# Patient Record
Sex: Male | Born: 1995
Health system: Southern US, Community
[De-identification: ages and names within clinical notes are randomized; demographics above are authoritative.]

## PROBLEM LIST (undated history)

## (undated) DIAGNOSIS — L700 Acne vulgaris: Secondary | ICD-10-CM

## (undated) DIAGNOSIS — B2790 Infectious mononucleosis, unspecified without complication: Principal | ICD-10-CM

## (undated) DIAGNOSIS — T7840XA Allergy, unspecified, initial encounter: Secondary | ICD-10-CM

## (undated) HISTORY — DX: Infectious mononucleosis, unspecified without complication: B27.90

## (undated) HISTORY — DX: Acne vulgaris: L70.0

## (undated) HISTORY — DX: Allergy, unspecified, initial encounter: T78.40XA

---
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
## 2017-03-18 DIAGNOSIS — F1420 Cocaine dependence, uncomplicated: Secondary | ICD-10-CM | POA: Diagnosis not present

## 2017-03-18 DIAGNOSIS — F1620 Hallucinogen dependence, uncomplicated: Secondary | ICD-10-CM | POA: Diagnosis not present

## 2017-03-18 DIAGNOSIS — F1220 Cannabis dependence, uncomplicated: Secondary | ICD-10-CM | POA: Diagnosis not present

## 2017-03-18 DIAGNOSIS — F1320 Sedative, hypnotic or anxiolytic dependence, uncomplicated: Secondary | ICD-10-CM | POA: Diagnosis not present

## 2017-03-19 DIAGNOSIS — F1420 Cocaine dependence, uncomplicated: Secondary | ICD-10-CM | POA: Diagnosis not present

## 2017-03-19 DIAGNOSIS — F1620 Hallucinogen dependence, uncomplicated: Secondary | ICD-10-CM | POA: Diagnosis not present

## 2017-03-19 DIAGNOSIS — F1320 Sedative, hypnotic or anxiolytic dependence, uncomplicated: Secondary | ICD-10-CM | POA: Diagnosis not present

## 2017-03-19 DIAGNOSIS — F1220 Cannabis dependence, uncomplicated: Secondary | ICD-10-CM | POA: Diagnosis not present

## 2017-03-20 DIAGNOSIS — F1420 Cocaine dependence, uncomplicated: Secondary | ICD-10-CM | POA: Diagnosis not present

## 2017-03-20 DIAGNOSIS — F1220 Cannabis dependence, uncomplicated: Secondary | ICD-10-CM | POA: Diagnosis not present

## 2017-03-20 DIAGNOSIS — F1620 Hallucinogen dependence, uncomplicated: Secondary | ICD-10-CM | POA: Diagnosis not present

## 2017-03-20 DIAGNOSIS — F1320 Sedative, hypnotic or anxiolytic dependence, uncomplicated: Secondary | ICD-10-CM | POA: Diagnosis not present

## 2017-03-21 DIAGNOSIS — F1320 Sedative, hypnotic or anxiolytic dependence, uncomplicated: Secondary | ICD-10-CM | POA: Diagnosis not present

## 2017-03-21 DIAGNOSIS — F1420 Cocaine dependence, uncomplicated: Secondary | ICD-10-CM | POA: Diagnosis not present

## 2017-03-21 DIAGNOSIS — F1220 Cannabis dependence, uncomplicated: Secondary | ICD-10-CM | POA: Diagnosis not present

## 2017-03-21 DIAGNOSIS — F1620 Hallucinogen dependence, uncomplicated: Secondary | ICD-10-CM | POA: Diagnosis not present

## 2017-03-22 DIAGNOSIS — F1320 Sedative, hypnotic or anxiolytic dependence, uncomplicated: Secondary | ICD-10-CM | POA: Diagnosis not present

## 2017-03-22 DIAGNOSIS — F1420 Cocaine dependence, uncomplicated: Secondary | ICD-10-CM | POA: Diagnosis not present

## 2017-03-22 DIAGNOSIS — F1220 Cannabis dependence, uncomplicated: Secondary | ICD-10-CM | POA: Diagnosis not present

## 2017-03-22 DIAGNOSIS — F1620 Hallucinogen dependence, uncomplicated: Secondary | ICD-10-CM | POA: Diagnosis not present

## 2017-03-25 DIAGNOSIS — F1220 Cannabis dependence, uncomplicated: Secondary | ICD-10-CM | POA: Diagnosis not present

## 2017-03-25 DIAGNOSIS — F1320 Sedative, hypnotic or anxiolytic dependence, uncomplicated: Secondary | ICD-10-CM | POA: Diagnosis not present

## 2017-03-25 DIAGNOSIS — F1620 Hallucinogen dependence, uncomplicated: Secondary | ICD-10-CM | POA: Diagnosis not present

## 2017-03-25 DIAGNOSIS — F1420 Cocaine dependence, uncomplicated: Secondary | ICD-10-CM | POA: Diagnosis not present

## 2017-03-26 DIAGNOSIS — F1220 Cannabis dependence, uncomplicated: Secondary | ICD-10-CM | POA: Diagnosis not present

## 2017-03-26 DIAGNOSIS — F1420 Cocaine dependence, uncomplicated: Secondary | ICD-10-CM | POA: Diagnosis not present

## 2017-03-26 DIAGNOSIS — F1320 Sedative, hypnotic or anxiolytic dependence, uncomplicated: Secondary | ICD-10-CM | POA: Diagnosis not present

## 2017-03-26 DIAGNOSIS — F1620 Hallucinogen dependence, uncomplicated: Secondary | ICD-10-CM | POA: Diagnosis not present

## 2017-03-27 DIAGNOSIS — F1320 Sedative, hypnotic or anxiolytic dependence, uncomplicated: Secondary | ICD-10-CM | POA: Diagnosis not present

## 2017-03-27 DIAGNOSIS — F1420 Cocaine dependence, uncomplicated: Secondary | ICD-10-CM | POA: Diagnosis not present

## 2017-03-27 DIAGNOSIS — F1220 Cannabis dependence, uncomplicated: Secondary | ICD-10-CM | POA: Diagnosis not present

## 2017-03-27 DIAGNOSIS — F1620 Hallucinogen dependence, uncomplicated: Secondary | ICD-10-CM | POA: Diagnosis not present

## 2017-03-28 DIAGNOSIS — F1220 Cannabis dependence, uncomplicated: Secondary | ICD-10-CM | POA: Diagnosis not present

## 2017-03-28 DIAGNOSIS — F1620 Hallucinogen dependence, uncomplicated: Secondary | ICD-10-CM | POA: Diagnosis not present

## 2017-03-28 DIAGNOSIS — F1320 Sedative, hypnotic or anxiolytic dependence, uncomplicated: Secondary | ICD-10-CM | POA: Diagnosis not present

## 2017-03-28 DIAGNOSIS — F1420 Cocaine dependence, uncomplicated: Secondary | ICD-10-CM | POA: Diagnosis not present

## 2017-03-29 DIAGNOSIS — F1320 Sedative, hypnotic or anxiolytic dependence, uncomplicated: Secondary | ICD-10-CM | POA: Diagnosis not present

## 2017-03-29 DIAGNOSIS — F1220 Cannabis dependence, uncomplicated: Secondary | ICD-10-CM | POA: Diagnosis not present

## 2017-03-29 DIAGNOSIS — F1420 Cocaine dependence, uncomplicated: Secondary | ICD-10-CM | POA: Diagnosis not present

## 2017-03-29 DIAGNOSIS — F1620 Hallucinogen dependence, uncomplicated: Secondary | ICD-10-CM | POA: Diagnosis not present

## 2017-04-01 DIAGNOSIS — F1320 Sedative, hypnotic or anxiolytic dependence, uncomplicated: Secondary | ICD-10-CM | POA: Diagnosis not present

## 2017-04-01 DIAGNOSIS — F1420 Cocaine dependence, uncomplicated: Secondary | ICD-10-CM | POA: Diagnosis not present

## 2017-04-01 DIAGNOSIS — F1220 Cannabis dependence, uncomplicated: Secondary | ICD-10-CM | POA: Diagnosis not present

## 2017-04-01 DIAGNOSIS — F1620 Hallucinogen dependence, uncomplicated: Secondary | ICD-10-CM | POA: Diagnosis not present

## 2017-04-02 DIAGNOSIS — F1420 Cocaine dependence, uncomplicated: Secondary | ICD-10-CM | POA: Diagnosis not present

## 2017-04-02 DIAGNOSIS — F1620 Hallucinogen dependence, uncomplicated: Secondary | ICD-10-CM | POA: Diagnosis not present

## 2017-04-02 DIAGNOSIS — F1220 Cannabis dependence, uncomplicated: Secondary | ICD-10-CM | POA: Diagnosis not present

## 2017-04-02 DIAGNOSIS — F1320 Sedative, hypnotic or anxiolytic dependence, uncomplicated: Secondary | ICD-10-CM | POA: Diagnosis not present

## 2017-04-03 DIAGNOSIS — F1220 Cannabis dependence, uncomplicated: Secondary | ICD-10-CM | POA: Diagnosis not present

## 2017-04-03 DIAGNOSIS — F1320 Sedative, hypnotic or anxiolytic dependence, uncomplicated: Secondary | ICD-10-CM | POA: Diagnosis not present

## 2017-04-03 DIAGNOSIS — F1620 Hallucinogen dependence, uncomplicated: Secondary | ICD-10-CM | POA: Diagnosis not present

## 2017-04-03 DIAGNOSIS — F1420 Cocaine dependence, uncomplicated: Secondary | ICD-10-CM | POA: Diagnosis not present

## 2017-04-04 DIAGNOSIS — F1620 Hallucinogen dependence, uncomplicated: Secondary | ICD-10-CM | POA: Diagnosis not present

## 2017-04-04 DIAGNOSIS — F1220 Cannabis dependence, uncomplicated: Secondary | ICD-10-CM | POA: Diagnosis not present

## 2017-04-04 DIAGNOSIS — F1420 Cocaine dependence, uncomplicated: Secondary | ICD-10-CM | POA: Diagnosis not present

## 2017-04-04 DIAGNOSIS — F1320 Sedative, hypnotic or anxiolytic dependence, uncomplicated: Secondary | ICD-10-CM | POA: Diagnosis not present

## 2017-04-05 DIAGNOSIS — F1420 Cocaine dependence, uncomplicated: Secondary | ICD-10-CM | POA: Diagnosis not present

## 2017-04-05 DIAGNOSIS — F1320 Sedative, hypnotic or anxiolytic dependence, uncomplicated: Secondary | ICD-10-CM | POA: Diagnosis not present

## 2017-04-05 DIAGNOSIS — F1620 Hallucinogen dependence, uncomplicated: Secondary | ICD-10-CM | POA: Diagnosis not present

## 2017-04-05 DIAGNOSIS — F1220 Cannabis dependence, uncomplicated: Secondary | ICD-10-CM | POA: Diagnosis not present

## 2017-04-08 DIAGNOSIS — F1320 Sedative, hypnotic or anxiolytic dependence, uncomplicated: Secondary | ICD-10-CM | POA: Diagnosis not present

## 2017-04-08 DIAGNOSIS — F1620 Hallucinogen dependence, uncomplicated: Secondary | ICD-10-CM | POA: Diagnosis not present

## 2017-04-08 DIAGNOSIS — F1220 Cannabis dependence, uncomplicated: Secondary | ICD-10-CM | POA: Diagnosis not present

## 2017-04-08 DIAGNOSIS — F1420 Cocaine dependence, uncomplicated: Secondary | ICD-10-CM | POA: Diagnosis not present

## 2017-04-09 DIAGNOSIS — F1420 Cocaine dependence, uncomplicated: Secondary | ICD-10-CM | POA: Diagnosis not present

## 2017-04-09 DIAGNOSIS — F1320 Sedative, hypnotic or anxiolytic dependence, uncomplicated: Secondary | ICD-10-CM | POA: Diagnosis not present

## 2017-04-09 DIAGNOSIS — F1620 Hallucinogen dependence, uncomplicated: Secondary | ICD-10-CM | POA: Diagnosis not present

## 2017-04-09 DIAGNOSIS — F1220 Cannabis dependence, uncomplicated: Secondary | ICD-10-CM | POA: Diagnosis not present

## 2017-04-10 DIAGNOSIS — F1620 Hallucinogen dependence, uncomplicated: Secondary | ICD-10-CM | POA: Diagnosis not present

## 2017-04-10 DIAGNOSIS — F1320 Sedative, hypnotic or anxiolytic dependence, uncomplicated: Secondary | ICD-10-CM | POA: Diagnosis not present

## 2017-04-10 DIAGNOSIS — F1420 Cocaine dependence, uncomplicated: Secondary | ICD-10-CM | POA: Diagnosis not present

## 2017-04-10 DIAGNOSIS — F1220 Cannabis dependence, uncomplicated: Secondary | ICD-10-CM | POA: Diagnosis not present

## 2017-04-11 DIAGNOSIS — F1220 Cannabis dependence, uncomplicated: Secondary | ICD-10-CM | POA: Diagnosis not present

## 2017-04-11 DIAGNOSIS — F1320 Sedative, hypnotic or anxiolytic dependence, uncomplicated: Secondary | ICD-10-CM | POA: Diagnosis not present

## 2017-04-11 DIAGNOSIS — F1420 Cocaine dependence, uncomplicated: Secondary | ICD-10-CM | POA: Diagnosis not present

## 2017-04-11 DIAGNOSIS — F1620 Hallucinogen dependence, uncomplicated: Secondary | ICD-10-CM | POA: Diagnosis not present

## 2017-04-12 DIAGNOSIS — F1420 Cocaine dependence, uncomplicated: Secondary | ICD-10-CM | POA: Diagnosis not present

## 2017-04-12 DIAGNOSIS — F1620 Hallucinogen dependence, uncomplicated: Secondary | ICD-10-CM | POA: Diagnosis not present

## 2017-04-12 DIAGNOSIS — F1220 Cannabis dependence, uncomplicated: Secondary | ICD-10-CM | POA: Diagnosis not present

## 2017-04-12 DIAGNOSIS — F1320 Sedative, hypnotic or anxiolytic dependence, uncomplicated: Secondary | ICD-10-CM | POA: Diagnosis not present

## 2017-04-15 DIAGNOSIS — F1420 Cocaine dependence, uncomplicated: Secondary | ICD-10-CM | POA: Diagnosis not present

## 2017-04-15 DIAGNOSIS — F1320 Sedative, hypnotic or anxiolytic dependence, uncomplicated: Secondary | ICD-10-CM | POA: Diagnosis not present

## 2017-04-15 DIAGNOSIS — F1220 Cannabis dependence, uncomplicated: Secondary | ICD-10-CM | POA: Diagnosis not present

## 2017-04-15 DIAGNOSIS — F1620 Hallucinogen dependence, uncomplicated: Secondary | ICD-10-CM | POA: Diagnosis not present

## 2017-04-16 DIAGNOSIS — F1320 Sedative, hypnotic or anxiolytic dependence, uncomplicated: Secondary | ICD-10-CM | POA: Diagnosis not present

## 2017-04-16 DIAGNOSIS — F1420 Cocaine dependence, uncomplicated: Secondary | ICD-10-CM | POA: Diagnosis not present

## 2017-04-16 DIAGNOSIS — F1620 Hallucinogen dependence, uncomplicated: Secondary | ICD-10-CM | POA: Diagnosis not present

## 2017-04-16 DIAGNOSIS — F1220 Cannabis dependence, uncomplicated: Secondary | ICD-10-CM | POA: Diagnosis not present

## 2017-04-17 DIAGNOSIS — F1620 Hallucinogen dependence, uncomplicated: Secondary | ICD-10-CM | POA: Diagnosis not present

## 2017-04-17 DIAGNOSIS — F1420 Cocaine dependence, uncomplicated: Secondary | ICD-10-CM | POA: Diagnosis not present

## 2017-04-17 DIAGNOSIS — F1320 Sedative, hypnotic or anxiolytic dependence, uncomplicated: Secondary | ICD-10-CM | POA: Diagnosis not present

## 2017-04-17 DIAGNOSIS — F1220 Cannabis dependence, uncomplicated: Secondary | ICD-10-CM | POA: Diagnosis not present

## 2017-04-18 DIAGNOSIS — F1320 Sedative, hypnotic or anxiolytic dependence, uncomplicated: Secondary | ICD-10-CM | POA: Diagnosis not present

## 2017-04-18 DIAGNOSIS — F1220 Cannabis dependence, uncomplicated: Secondary | ICD-10-CM | POA: Diagnosis not present

## 2017-04-18 DIAGNOSIS — F1620 Hallucinogen dependence, uncomplicated: Secondary | ICD-10-CM | POA: Diagnosis not present

## 2017-04-18 DIAGNOSIS — F1420 Cocaine dependence, uncomplicated: Secondary | ICD-10-CM | POA: Diagnosis not present

## 2017-04-19 DIAGNOSIS — F1320 Sedative, hypnotic or anxiolytic dependence, uncomplicated: Secondary | ICD-10-CM | POA: Diagnosis not present

## 2017-04-19 DIAGNOSIS — F1220 Cannabis dependence, uncomplicated: Secondary | ICD-10-CM | POA: Diagnosis not present

## 2017-04-19 DIAGNOSIS — F1620 Hallucinogen dependence, uncomplicated: Secondary | ICD-10-CM | POA: Diagnosis not present

## 2017-04-19 DIAGNOSIS — F1420 Cocaine dependence, uncomplicated: Secondary | ICD-10-CM | POA: Diagnosis not present

## 2017-04-22 DIAGNOSIS — F1420 Cocaine dependence, uncomplicated: Secondary | ICD-10-CM | POA: Diagnosis not present

## 2017-04-22 DIAGNOSIS — F1220 Cannabis dependence, uncomplicated: Secondary | ICD-10-CM | POA: Diagnosis not present

## 2017-04-22 DIAGNOSIS — F1320 Sedative, hypnotic or anxiolytic dependence, uncomplicated: Secondary | ICD-10-CM | POA: Diagnosis not present

## 2017-04-22 DIAGNOSIS — F1620 Hallucinogen dependence, uncomplicated: Secondary | ICD-10-CM | POA: Diagnosis not present

## 2017-04-23 DIAGNOSIS — F1620 Hallucinogen dependence, uncomplicated: Secondary | ICD-10-CM | POA: Diagnosis not present

## 2017-04-23 DIAGNOSIS — F1320 Sedative, hypnotic or anxiolytic dependence, uncomplicated: Secondary | ICD-10-CM | POA: Diagnosis not present

## 2017-04-23 DIAGNOSIS — F1420 Cocaine dependence, uncomplicated: Secondary | ICD-10-CM | POA: Diagnosis not present

## 2017-04-23 DIAGNOSIS — F1220 Cannabis dependence, uncomplicated: Secondary | ICD-10-CM | POA: Diagnosis not present

## 2017-04-24 DIAGNOSIS — F1220 Cannabis dependence, uncomplicated: Secondary | ICD-10-CM | POA: Diagnosis not present

## 2017-04-24 DIAGNOSIS — F1420 Cocaine dependence, uncomplicated: Secondary | ICD-10-CM | POA: Diagnosis not present

## 2017-04-24 DIAGNOSIS — F1320 Sedative, hypnotic or anxiolytic dependence, uncomplicated: Secondary | ICD-10-CM | POA: Diagnosis not present

## 2017-04-24 DIAGNOSIS — F1620 Hallucinogen dependence, uncomplicated: Secondary | ICD-10-CM | POA: Diagnosis not present

## 2017-04-25 DIAGNOSIS — F1220 Cannabis dependence, uncomplicated: Secondary | ICD-10-CM | POA: Diagnosis not present

## 2017-04-25 DIAGNOSIS — F1320 Sedative, hypnotic or anxiolytic dependence, uncomplicated: Secondary | ICD-10-CM | POA: Diagnosis not present

## 2017-04-25 DIAGNOSIS — F1420 Cocaine dependence, uncomplicated: Secondary | ICD-10-CM | POA: Diagnosis not present

## 2017-04-25 DIAGNOSIS — F1620 Hallucinogen dependence, uncomplicated: Secondary | ICD-10-CM | POA: Diagnosis not present

## 2017-04-26 DIAGNOSIS — F1420 Cocaine dependence, uncomplicated: Secondary | ICD-10-CM | POA: Diagnosis not present

## 2017-04-26 DIAGNOSIS — F1220 Cannabis dependence, uncomplicated: Secondary | ICD-10-CM | POA: Diagnosis not present

## 2017-04-26 DIAGNOSIS — F1620 Hallucinogen dependence, uncomplicated: Secondary | ICD-10-CM | POA: Diagnosis not present

## 2017-04-26 DIAGNOSIS — F1320 Sedative, hypnotic or anxiolytic dependence, uncomplicated: Secondary | ICD-10-CM | POA: Diagnosis not present

## 2017-06-21 ENCOUNTER — Telehealth: Payer: Self-pay | Admitting: Family Medicine

## 2017-06-21 NOTE — Telephone Encounter (Signed)
Sent to PCP for approval.  

## 2017-06-21 NOTE — Telephone Encounter (Signed)
Call in Depakote 125 mg to give bid, #10

## 2017-06-21 NOTE — Telephone Encounter (Signed)
Copied from CRM [id number]. Topic: Quick Communication - See Telephone Encounter >> Jun 21, 2017  3:25 PM Landry MellowFoltz, Melissa J wrote: CRM for notification. See Telephone encounter for:   06/21/17. Pt will be going into intensive outpatient rehab next Friday.  The doctor at that facility is recommending that the pt have 5 days of low dose depakote before he goes into rehab. Mom will be the one giving it to him. Mom says that pt is currently taking 2 xanax /day for the last 2 weeks.  Pharm [facility] [address] cb # for mom is [phone]

## 2017-06-24 MED ORDER — DIVALPROEX SODIUM 125 MG PO DR TAB: 125.0000 mg | DELAYED_RELEASE_TABLET | Freq: Two times a day (BID) | ORAL | 0 refills | Status: DC

## 2017-06-24 NOTE — Telephone Encounter (Signed)
Rx was sent. Called and spoke to pt's mother. Mother advised and voiced understanding.

## 2017-11-06 ENCOUNTER — Encounter: Payer: Self-pay | Admitting: Family Medicine

## 2017-11-06 ENCOUNTER — Ambulatory Visit (INDEPENDENT_AMBULATORY_CARE_PROVIDER_SITE_OTHER): Payer: Self-pay | Admitting: Family Medicine

## 2017-11-06 VITALS — BP 112/62 | HR 77 | Temp 98.4°F | Ht 70.0 in | Wt 147.7 lb

## 2017-11-06 DIAGNOSIS — W57XXXA Bitten or stung by nonvenomous insect and other nonvenomous arthropods, initial encounter: Secondary | ICD-10-CM

## 2017-11-06 DIAGNOSIS — H02846 Edema of left eye, unspecified eyelid: Secondary | ICD-10-CM

## 2017-11-06 DIAGNOSIS — L237 Allergic contact dermatitis due to plants, except food: Secondary | ICD-10-CM

## 2017-11-06 MED ORDER — PREDNISONE 10 MG PO TABS: ORAL_TABLET | ORAL | 0 refills | Status: DC

## 2017-11-06 MED ORDER — DOXYCYCLINE HYCLATE 100 MG PO TABS: 200.0000 mg | ORAL_TABLET | Freq: Once | ORAL | 0 refills | Status: AC

## 2017-11-06 NOTE — Progress Notes (Signed)
Subjective:    Patient ID: Kevin Howe, male    DOB: 04-03-1996, 22 y.o.   MRN: [id number]  Chief Complaint  Patient presents with  . Poison Ivy    poss poison ivyis left eye. Pt stated that he discovered poison ivy on his arms about two days ago left eye doesn't ear feels more painful. Pt cuts down trees for work   Patient is accompanied by his girlfriend.  HPI Patient was seen today for acute concern.  Pt endorses cutting down some trees/bushes for work on Friday when some poison ivy fell against him.  Pt notes pruritic rash on right forearm, posterior right leg, left leg.  Pt also endorses left eyelid edema.  Pt states he has been rubbing his eye.  Pt denies pain with movement of eyeball, facial pain, changes in vision.  Pt also endorses finding several ticks on him over the weekend. Does not report them being attached.  Pt has tried hydrocortisone cream.   Past Medical History:  Diagnosis Date  . Acne vulgaris    sees Dr. Terri Piedra   . Allergy    saw Dr. Sidney Ace   . Infectious mononucleosis 03-11-14    Allergies  Allergen Reactions  . Peanut-Containing Drug Products Hives    ROS General: Denies fever, chills, night sweats, changes in weight, changes in appetite HEENT: Denies headaches, ear pain, changes in vision, rhinorrhea, sore throat  + eyelid edema, left CV: Denies CP, palpitations, SOB, orthopnea Pulm: Denies SOB, cough, wheezing GI: Denies abdominal pain, nausea, vomiting, diarrhea, constipation GU: Denies dysuria, hematuria, frequency, vaginal discharge Msk: Denies muscle cramps, joint pains Neuro: Denies weakness, numbness, tingling Skin: Denies rashes, bruising  +rash on R forearm, and LEs b/l Psych: Denies depression, anxiety, hallucinations     Objective:    Blood pressure 112/62, pulse 77, temperature 98.4 F (36.9 C), temperature source Oral, height  (1.778 m), weight 147 lb 11.2 oz (67 kg), SpO2 98 %.   Gen. Pleasant, well-nourished, in no  distress, normal affect   HEENT: [place]/AT, L eyelid with mild erythema and edema, conjunctiva mildly injected, no scleral icterus, PERRLA, EOMI, no periorbital edema or TTP.  Nares patent without drainage, Woods lamp exam of L eye negative, no abrasions noted. Lungs: no accessory muscle use Cardiovascular: RRR,  no peripheral edema Neuro:  A&Ox3, CN II-XII intact, normal gait Skin:  Warm, dry intact.  Erythematous vesicles in a linear distribution on R forearm, R posterior thigh, and L leg.  L eyelid with mild erythema and edema.   Wt Readings from Last 3 Encounters:  11/06/17 147 lb 11.2 oz (67 kg) (40 %, Z= -0.25)*  08/29/15 150 lb (68 kg) (60 %, Z= 0.26)*  05/20/13 138 lb (62.6 kg) (73 %, Z= 0.61)*   * Growth percentiles are based on CDC (Boys, 2-20 Years) data.    Lab Results  Component Value Date   WBC 19.6 Repeated and verified X2. (HH) 03/22/2014   HGB 14.3 03/22/2014   HCT 42.6 03/22/2014   PLT 275.0 03/22/2014   ALT 63 (H) 03/22/2014   AST 48 (H) 03/22/2014    Assessment/Plan:  Contact dermatitis due to poison ivy -advised to wash clothing worn on day of contact and any items he was in contact with thereafter. -advised to avoid scratching as can cause a secondary infection.  -Given written and verbal instructions on 14 d prednisone taper.  6-6-5-5-4-4-3-3-2-2-1-1-1/2-1/2. - Plan: predniSONE (DELTASONE) 10 MG tablet  Eyelid edema, left -no lesions noted  on eye on exam. -pt advised to refrain from rubbing eye -consider allergy eye gtts -ice -given RTC or ED precautions for increased edema of eyelid or orbit, changes in vision, pain in eye or periorbital area.  Tick bite, initial encounter  -will give ppx dose of doxy for lyme dz. - Plan: doxycycline (VIBRA-TABS) 100 MG tablet -wear insect repellant when outdoors.  F/u prn for worsening symptoms  Abbe Amsterdam, MD

## 2017-11-06 NOTE — Patient Instructions (Addendum)
You have been given a prescription for prednisone 10 mg tablets.  You are to start taking prednisone taper tomorrow morning.  You will take 6 tablets on days 1 and 2.  Take 5 tablets on days 3 and 4.  Take 4 tablets on days 5 and 6.  Take 3 tablets on days 7 and 8.  Take 2 tablets on days 9 and 10.  Take 1 tablet on day 11 and to well.  Take one half a tablet on days 13 and 14.  If your eye continues to become swollen or you have pain with movement of your eye you should proceed to the emergency department as soon as possible.  Poison Ivy Dermatitis Poison ivy dermatitis is inflammation of the skin that is caused by the allergens on the leaves of the poison ivy plant. The skin reaction often involves redness, swelling, blisters, and extreme itching. What are the causes? This condition is caused by a specific chemical (urushiol) found in the sap of the poison ivy plant. This chemical is sticky and can be easily spread to people, animals, and objects. You can get poison ivy dermatitis by:  Having direct contact with a poison ivy plant.  Touching animals, other people, or objects that have come in contact with poison ivy and have the chemical on them.  What increases the risk? This condition is more likely to develop in:  People who are outdoors often.  People who go outdoors without wearing protective clothing, such as closed shoes, long pants, and a long-sleeved shirt.  What are the signs or symptoms? Symptoms of this condition include:  Redness and itching.  A rash that often includes bumps and blisters. The rash usually appears 48 hours after exposure.  Swelling. This may occur if the reaction is more severe.  Symptoms usually last for 1-2 weeks. However, the first time you develop this condition, symptoms may last 3-4 weeks. How is this diagnosed? This condition may be diagnosed based on your symptoms and a physical exam. Your health care provider may also ask you about any recent  outdoor activity. How is this treated? Treatment for this condition will vary depending on how severe it is. Treatment may include:  Hydrocortisone creams or calamine lotions to relieve itching.  Oatmeal baths to soothe the skin.  Over-the-counter antihistamine tablets.  Oral steroid medicine for more severe outbreaks.  Follow these instructions at home:  Take or apply over-the-counter and prescription medicines only as told by your health care provider.  Wash exposed skin as soon as possible with soap and cold water.  Use hydrocortisone creams or calamine lotion as needed to soothe the skin and relieve itching.  Take oatmeal baths as needed. Use colloidal oatmeal. You can get this at your local pharmacy or grocery store. Follow the instructions on the packaging.  Do not scratch or rub your skin.  While you have the rash, wash clothes right after you wear them. How is this prevented?  Learn to identify the poison ivy plant and avoid contact with the plant. This plant can be recognized by the number of leaves. Generally, poison ivy has three leaves with flowering branches on a single stem. The leaves are typically glossy, and they have jagged edges that come to a point at the front.  If you have been exposed to poison ivy, thoroughly wash with soap and water right away. You have about 30 minutes to remove the plant resin before it will cause the rash. Be sure to  wash under your fingernails because any plant resin there will continue to spread the rash.  When hiking or camping, wear clothes that will help you to avoid exposure on the skin. This includes long pants, a long-sleeved shirt, tall socks, and hiking boots. You can also apply preventive lotion to your skin to help limit exposure.  If you suspect that your clothes or outdoor gear came in contact with poison ivy, rinse them off outside with a garden hose before you bring them inside your house. Contact a health care provider  if:  You have open sores in the rash area.  You have more redness, swelling, or pain in the affected area.  You have redness that spreads beyond the rash area.  You have fluid, blood, or pus coming from the affected area.  You have a fever.  You have a rash over a large area of your body.  You have a rash on your eyes, mouth, or genitals.  Your rash does not improve after a few days. Get help right away if:  Your face swells or your eyes swell shut.  You have trouble breathing.  You have trouble swallowing. This information is not intended to replace advice given to you by your health care provider. Make sure you discuss any questions you have with your health care provider. Document Released: 06/15/2000 Document Revised: 11/24/2015 Document Reviewed: 11/24/2014 Elsevier Interactive Patient Education  2018 [employer].  Tick Bite Information, Adult Ticks are insects that can bite. Most ticks live in shrubs and grassy areas. They climb onto people and animals that go by. Then they bite. Some ticks carry germs that can make you sick. How can I prevent tick bites?  Use an insect repellent that has 20% or higher of the ingredients DEET, picaridin, or IR3535. Put this insect repellent on: ? Bare skin. ? The tops of your boots. ? Your pant legs. ? The ends of your sleeves.  If you use an insect repellent that has the ingredient permethrin, make sure to follow the instructions on the bottle. Treat the following: ? Clothing. ? Supplies. ? Boots. ? Tents.  Wear long sleeves, long pants, and light colors.  Tuck your pant legs into your socks.  Stay in the middle of the trail.  Try not to walk through long grass.  Before going inside your house, check your clothes, hair, and skin for ticks. Make sure to check your head, neck, armpits, waist, groin, and joint areas.  Check for ticks every day.  When you come indoors: ? Wash your clothes right away. ? Shower right  away. ? Dry your clothes in a dryer on high heat for 60 minutes or more. What is the right way to remove a tick? Remove a tick from your skin as soon as possible.  To remove a tick that is crawling on your skin: ? Go outdoors and brush the tick off. ? Use tape or a lint roller.  To remove a tick that is biting: ? Wash your hands. ? If you have latex gloves, put them on. ? Use tweezers, curved forceps, or a tick-removal tool to grasp the tick. Grasp the tick as close to your skin and as close to the tick's head as possible. ? Gently pull up until the tick lets go.  Try to keep the tick's head attached to its body.  Do not twist or jerk the tick.  Do not squeeze or crush the tick.  Do not try to remove  a tick with heat, alcohol, petroleum jelly, or fingernail polish. How should I get rid of a tick? Here are some ways to get rid of a tick that is alive:  Place the tick in rubbing alcohol.  Place the tick in a bag or container you can close tightly.  Wrap the tick tightly in tape.  Flush the tick down the toilet.  Contact a doctor if:  You have symptoms of a disease, such as: ? Pain in a muscle, joint, or bone. ? Trouble walking or moving your legs. ? Numbness in your legs. ? Inability to move (paralysis). ? A red rash that makes a circle (bull's-eye rash). ? Redness and swelling where the tick bit you. ? A fever. ? Throwing up (vomiting) over and over. ? Diarrhea. ? Weight loss. ? Tender and swollen lymph glands. ? Shortness of breath. ? Cough. ? Belly pain (abdominal pain). ? Headache. ? Being more tired than normal. ? A change in how alert (conscious) you are. ? Confusion. Get help right away if:  You cannot remove a tick.  A part of a tick breaks off and gets stuck in your skin.  You are feeling worse. Summary  Ticks may carry germs that can make you sick.  To prevent tick bites, wear long sleeves, long pants, and light colors. Use insect repellent.  Follow the instructions on the bottle.  If the tick is biting, do not try to remove it with heat, alcohol, petroleum jelly, or fingernail polish.  Use tweezers, curved forceps, or a tick-removal tool to grasp the tick. Gently pull up until the tick lets go. Do not twist or jerk the tick. Do not squeeze or crush the tick.  If you have symptoms, contact a doctor. This information is not intended to replace advice given to you by your health care provider. Make sure you discuss any questions you have with your health care provider. Document Released: 09/12/2009 Document Revised: 09/28/2016 Document Reviewed: 09/28/2016 Elsevier Interactive Patient Education  2018 [employer].

## 2017-12-20 ENCOUNTER — Ambulatory Visit (INDEPENDENT_AMBULATORY_CARE_PROVIDER_SITE_OTHER): Payer: 59 | Admitting: Family Medicine

## 2017-12-20 ENCOUNTER — Encounter: Payer: Self-pay | Admitting: Family Medicine

## 2017-12-20 VITALS — BP 96/68 | HR 78 | Temp 98.3°F | Ht 70.0 in | Wt 139.0 lb

## 2017-12-20 DIAGNOSIS — R1084 Generalized abdominal pain: Secondary | ICD-10-CM | POA: Diagnosis not present

## 2017-12-20 LAB — BASIC METABOLIC PANEL
BUN: 13 mg/dL (ref 6–23)
CALCIUM: 10 mg/dL (ref 8.4–10.5)
CO2: 30 mEq/L (ref 19–32)
CREATININE: 0.94 mg/dL (ref 0.40–1.50)
Chloride: 99 mEq/L (ref 96–112)
GFR: 109.33 mL/min (ref >60.00)
Glucose, Bld: 83 mg/dL (ref 70–99)
Potassium: 4.3 mEq/L (ref 3.5–5.1)
SODIUM: 137 meq/L (ref 135–145)

## 2017-12-20 LAB — HEPATIC FUNCTION PANEL
ALBUMIN: 5.1 g/dL (ref 3.5–5.2)
ALT: 8 U/L (ref 0–53)
AST: 13 U/L (ref 0–37)
Alkaline Phosphatase: 75 U/L (ref 52–171)
BILIRUBIN DIRECT: 0.2 mg/dL (ref 0.0–0.3)
Total Bilirubin: 0.9 mg/dL (ref 0.2–1.2)
Total Protein: 7.7 g/dL (ref 6.0–8.3)

## 2017-12-20 LAB — CBC WITH DIFFERENTIAL/PLATELET
BASOS ABS: 0 10*3/uL (ref 0.0–0.1)
Basophils Relative: 0.8 % (ref 0.0–3.0)
EOS PCT: 3.6 % (ref 0.0–5.0)
Eosinophils Absolute: 0.2 10*3/uL (ref 0.0–0.7)
HEMATOCRIT: 44.1 % (ref 36.0–49.0)
Hemoglobin: 15.4 g/dL (ref 12.0–16.0)
LYMPHS PCT: 28.2 % (ref 24.0–48.0)
Lymphs Abs: 1.5 10*3/uL (ref 0.7–4.0)
MCHC: 35 g/dL (ref 31.0–37.0)
MCV: 85.3 fl (ref 78.0–98.0)
MONOS PCT: 12.6 % — AB (ref 3.0–12.0)
Monocytes Absolute: 0.7 10*3/uL (ref 0.1–1.0)
NEUTROS ABS: 2.9 10*3/uL (ref 1.4–7.7)
Neutrophils Relative %: 54.8 % (ref 43.0–71.0)
Platelets: 345 10*3/uL (ref 150.0–575.0)
RBC: 5.17 Mil/uL (ref 3.80–5.70)
RDW: 12.7 % (ref 11.4–15.5)
WBC: 5.3 10*3/uL (ref 4.5–13.5)

## 2017-12-20 LAB — AMYLASE: Amylase: 53 U/L (ref 27–131)

## 2017-12-20 LAB — LIPASE: Lipase: 15 U/L (ref 11.0–59.0)

## 2017-12-20 LAB — SEDIMENTATION RATE: SED RATE: 19 mm/h — AB (ref 0–15)

## 2017-12-20 MED ORDER — CIPROFLOXACIN HCL 500 MG PO TABS: 500.0000 mg | ORAL_TABLET | Freq: Two times a day (BID) | ORAL | 0 refills | Status: DC

## 2017-12-20 MED ORDER — ONDANSETRON HCL 8 MG PO TABS: 8.0000 mg | ORAL_TABLET | Freq: Three times a day (TID) | ORAL | 0 refills | Status: DC | PRN

## 2017-12-20 MED ORDER — OMEPRAZOLE 40 MG PO CPDR: 40.0000 mg | DELAYED_RELEASE_CAPSULE | Freq: Every day | ORAL | 0 refills | Status: DC

## 2017-12-20 MED FILL — OMEPRAZOLE DR 40 MG CAPSULE: 40 | 30 days supply | Qty: 30 | Fill #0

## 2017-12-20 MED FILL — ONDANSETRON HCL 8 MG TABLET: 8 | 10 days supply | Qty: 30 | Fill #0

## 2017-12-20 MED FILL — CIPROFLOXACIN HCL 500 MG TA: 500 | 10 days supply | Qty: 20 | Fill #0

## 2017-12-20 NOTE — Progress Notes (Signed)
   Subjective:    Patient ID: Kevin Howe, male    DOB: March 10, 1996, 22 y.o.   MRN: [id number]  HPI Here with mother for 8 days of intermittent generalized abdominal pains, nausea with vomiting, and diarrhea. He thinks he may have had a fever the first day or two, but he never checked it. He can keep fluids down but most any type of food he eats are vomited back up. He has not seen any blood or dark material in the stool or vomitus. No recent traveling.    Review of Systems  Constitutional: Positive for fatigue. Negative for chills and diaphoresis.  Respiratory: Negative.   Cardiovascular: Negative.   Gastrointestinal: Positive for abdominal pain, diarrhea, nausea and vomiting. Negative for abdominal distention, anal bleeding, blood in stool and constipation.  Genitourinary: Negative.   Skin: Negative.   Neurological: Negative.        Objective:   Physical Exam  Constitutional: He appears well-developed and well-nourished. No distress.  Eyes: No scleral icterus.  Neck: No thyromegaly present.  Cardiovascular: Normal rate, regular rhythm, normal heart sounds and intact distal pulses.  Pulmonary/Chest: Effort normal and breath sounds normal. No stridor. No respiratory distress. He has no wheezes. He has no rales.  Abdominal: Soft. Bowel sounds are normal. He exhibits no distension and no mass. There is no rebound and no guarding. No hernia.  He has mild diffuse tenderness, more so in the epigastrium   Lymphadenopathy:    He has no cervical adenopathy.          Assessment & Plan:  One week of nausea, vomiting, diarrhea, and cramps. We will send him for labs to investigate further. This may be an infectious enteritis or possibly a gastric ulcer. We will treat for both of these possibilities with Cipro for 10 days and Omeprazole daily. He can use Zofran for nausea. He will drink water and Gatorade and try to eat bland foods like bread or potatoes. If he does not respond we may test  for inflammatory bowel diseases.  Gershon CraneStephen Frandy Basnett, MD

## 2017-12-24 DIAGNOSIS — F1321 Sedative, hypnotic or anxiolytic dependence, in remission: Secondary | ICD-10-CM | POA: Diagnosis not present

## 2017-12-24 DIAGNOSIS — F1421 Cocaine dependence, in remission: Secondary | ICD-10-CM | POA: Diagnosis not present

## 2017-12-24 DIAGNOSIS — F3289 Other specified depressive episodes: Secondary | ICD-10-CM | POA: Diagnosis not present

## 2017-12-25 ENCOUNTER — Encounter: Payer: Self-pay | Admitting: Family Medicine

## 2017-12-25 DIAGNOSIS — F1321 Sedative, hypnotic or anxiolytic dependence, in remission: Secondary | ICD-10-CM | POA: Diagnosis not present

## 2017-12-25 DIAGNOSIS — F1421 Cocaine dependence, in remission: Secondary | ICD-10-CM | POA: Diagnosis not present

## 2017-12-25 DIAGNOSIS — F3289 Other specified depressive episodes: Secondary | ICD-10-CM | POA: Diagnosis not present

## 2017-12-31 ENCOUNTER — Encounter: Payer: Self-pay | Admitting: Family Medicine

## 2017-12-31 DIAGNOSIS — F1421 Cocaine dependence, in remission: Secondary | ICD-10-CM | POA: Diagnosis not present

## 2017-12-31 DIAGNOSIS — F1321 Sedative, hypnotic or anxiolytic dependence, in remission: Secondary | ICD-10-CM | POA: Diagnosis not present

## 2017-12-31 DIAGNOSIS — F3289 Other specified depressive episodes: Secondary | ICD-10-CM | POA: Diagnosis not present

## 2018-01-03 ENCOUNTER — Encounter: Payer: Self-pay | Admitting: Family Medicine

## 2018-01-03 ENCOUNTER — Ambulatory Visit (INDEPENDENT_AMBULATORY_CARE_PROVIDER_SITE_OTHER): Payer: 59 | Admitting: Family Medicine

## 2018-01-03 ENCOUNTER — Telehealth: Payer: Self-pay | Admitting: *Deleted

## 2018-01-03 VITALS — BP 100/60 | HR 72 | Temp 98.1°F | Ht 71.0 in | Wt 141.6 lb

## 2018-01-03 DIAGNOSIS — R1010 Upper abdominal pain, unspecified: Secondary | ICD-10-CM | POA: Diagnosis not present

## 2018-01-03 MED ORDER — OMEPRAZOLE 40 MG PO CPDR: 40.0000 mg | DELAYED_RELEASE_CAPSULE | Freq: Two times a day (BID) | ORAL | 2 refills | Status: DC

## 2018-01-03 NOTE — Telephone Encounter (Signed)
I called the pts mother and informed her of the message below.  She stated he has been using both medications and this has not helped.  Per Dr Clent RidgesFry the pt should come in today and she scheduled an appt for him today at 3:30pm.

## 2018-01-03 NOTE — Telephone Encounter (Signed)
He may have gastritis or an ulcer. Call in Omeprazole 40 mg to take BID on an empty stomach, #60 with no rf. They can use Zofran along with it. If he is not better by next Monday, have him see me OV again

## 2018-01-03 NOTE — Progress Notes (Signed)
   Subjective:    Patient ID: Kevin Howe, male    DOB: 1995/08/28, 22 y.o.   MRN: [id number]  HPI Here to follow up on 3 weeks of upper abdominal pain with nausea and vomiting. We saw him on 12-20-17 for this and he had diarrhea and anorexia at that time. We treated him with 10 days of Cipro and with Omeprazole 40 mg daily. After the Cipro was finished he stopped taking the Omeprazole also. Now he is somewhat better, his appetite has improved, and the diarrhea has stopped. He has gained 2 lbs since his last visit. He still gets pain and nausea after eating meals, though he vomits less frequently.    Review of Systems  Constitutional: Negative.   Respiratory: Negative.   Cardiovascular: Negative.   Gastrointestinal: Positive for abdominal pain, nausea and vomiting. Negative for abdominal distention, anal bleeding, blood in stool, constipation and diarrhea.       Objective:   Physical Exam  Constitutional: He appears well-developed and well-nourished. No distress.  Cardiovascular: Normal rate, regular rhythm, normal heart sounds and intact distal pulses.  Pulmonary/Chest: Effort normal and breath sounds normal. No stridor. No respiratory distress. He has no wheezes. He has no rales.  Abdominal: Soft. Bowel sounds are normal. He exhibits no distension and no mass. There is no rebound and no guarding. No hernia.  Mildly tender across the entire upper abdomen           Assessment & Plan:  Upper abdominal pain that is most consistent with gastritis or gastric ulcer disease. We will set up an US to rule out gall bladder disease. He will get back on Omeprazole 40 mg and we will increase this to BID before meals.  Gershon CraneStephen Kaloni Bisaillon, MD

## 2018-01-10 DIAGNOSIS — F1421 Cocaine dependence, in remission: Secondary | ICD-10-CM | POA: Diagnosis not present

## 2018-01-10 DIAGNOSIS — F3289 Other specified depressive episodes: Secondary | ICD-10-CM | POA: Diagnosis not present

## 2018-01-10 DIAGNOSIS — F1321 Sedative, hypnotic or anxiolytic dependence, in remission: Secondary | ICD-10-CM | POA: Diagnosis not present

## 2018-01-23 DIAGNOSIS — F1321 Sedative, hypnotic or anxiolytic dependence, in remission: Secondary | ICD-10-CM | POA: Diagnosis not present

## 2018-01-23 DIAGNOSIS — F3289 Other specified depressive episodes: Secondary | ICD-10-CM | POA: Diagnosis not present

## 2018-01-23 DIAGNOSIS — F1421 Cocaine dependence, in remission: Secondary | ICD-10-CM | POA: Diagnosis not present

## 2018-01-24 ENCOUNTER — Ambulatory Visit
Admission: RE | Admit: 2018-01-24 | Discharge: 2018-01-24 | Disposition: A | Discharge status: Home / Self Care | Payer: 59 | Source: Ambulatory Visit | Attending: Family Medicine | Admitting: Family Medicine

## 2018-01-24 DIAGNOSIS — R112 Nausea with vomiting, unspecified: Secondary | ICD-10-CM | POA: Diagnosis not present

## 2018-01-24 DIAGNOSIS — R109 Unspecified abdominal pain: Secondary | ICD-10-CM | POA: Diagnosis not present

## 2018-01-24 DIAGNOSIS — R1010 Upper abdominal pain, unspecified: Secondary | ICD-10-CM

## 2018-01-31 DIAGNOSIS — F1321 Sedative, hypnotic or anxiolytic dependence, in remission: Secondary | ICD-10-CM | POA: Diagnosis not present

## 2018-01-31 DIAGNOSIS — F1421 Cocaine dependence, in remission: Secondary | ICD-10-CM | POA: Diagnosis not present

## 2018-01-31 DIAGNOSIS — F3289 Other specified depressive episodes: Secondary | ICD-10-CM | POA: Diagnosis not present

## 2018-02-21 DIAGNOSIS — F3289 Other specified depressive episodes: Secondary | ICD-10-CM | POA: Diagnosis not present

## 2018-02-21 DIAGNOSIS — F1321 Sedative, hypnotic or anxiolytic dependence, in remission: Secondary | ICD-10-CM | POA: Diagnosis not present

## 2018-02-21 DIAGNOSIS — F1421 Cocaine dependence, in remission: Secondary | ICD-10-CM | POA: Diagnosis not present

## 2018-03-21 DIAGNOSIS — F1321 Sedative, hypnotic or anxiolytic dependence, in remission: Secondary | ICD-10-CM | POA: Diagnosis not present

## 2018-03-21 DIAGNOSIS — F3289 Other specified depressive episodes: Secondary | ICD-10-CM | POA: Diagnosis not present

## 2018-03-21 DIAGNOSIS — F1421 Cocaine dependence, in remission: Secondary | ICD-10-CM | POA: Diagnosis not present

## 2018-03-24 DIAGNOSIS — H52222 Regular astigmatism, left eye: Secondary | ICD-10-CM | POA: Diagnosis not present

## 2018-04-04 DIAGNOSIS — F1421 Cocaine dependence, in remission: Secondary | ICD-10-CM | POA: Diagnosis not present

## 2018-04-04 DIAGNOSIS — F3289 Other specified depressive episodes: Secondary | ICD-10-CM | POA: Diagnosis not present

## 2018-04-04 DIAGNOSIS — F1321 Sedative, hypnotic or anxiolytic dependence, in remission: Secondary | ICD-10-CM | POA: Diagnosis not present

## 2018-04-18 DIAGNOSIS — F3289 Other specified depressive episodes: Secondary | ICD-10-CM | POA: Diagnosis not present

## 2018-04-18 DIAGNOSIS — F1321 Sedative, hypnotic or anxiolytic dependence, in remission: Secondary | ICD-10-CM | POA: Diagnosis not present

## 2018-04-18 DIAGNOSIS — F1421 Cocaine dependence, in remission: Secondary | ICD-10-CM | POA: Diagnosis not present

## 2018-05-09 DIAGNOSIS — F1321 Sedative, hypnotic or anxiolytic dependence, in remission: Secondary | ICD-10-CM | POA: Diagnosis not present

## 2018-05-09 DIAGNOSIS — F1421 Cocaine dependence, in remission: Secondary | ICD-10-CM | POA: Diagnosis not present

## 2018-05-09 DIAGNOSIS — F3289 Other specified depressive episodes: Secondary | ICD-10-CM | POA: Diagnosis not present

## 2018-05-19 ENCOUNTER — Encounter: Payer: Self-pay | Admitting: Family Medicine

## 2018-05-19 DIAGNOSIS — F3289 Other specified depressive episodes: Secondary | ICD-10-CM | POA: Diagnosis not present

## 2018-05-19 DIAGNOSIS — F1421 Cocaine dependence, in remission: Secondary | ICD-10-CM | POA: Diagnosis not present

## 2018-05-19 DIAGNOSIS — F1321 Sedative, hypnotic or anxiolytic dependence, in remission: Secondary | ICD-10-CM | POA: Diagnosis not present

## 2018-05-21 ENCOUNTER — Ambulatory Visit (INDEPENDENT_AMBULATORY_CARE_PROVIDER_SITE_OTHER): Payer: 59 | Admitting: Family Medicine

## 2018-05-21 ENCOUNTER — Encounter: Payer: Self-pay | Admitting: Family Medicine

## 2018-05-21 VITALS — BP 110/72 | HR 72 | Temp 98.1°F | Wt 147.4 lb

## 2018-05-21 DIAGNOSIS — F418 Other specified anxiety disorders: Secondary | ICD-10-CM

## 2018-05-21 MED ORDER — SERTRALINE HCL 50 MG PO TABS: 50.0000 mg | ORAL_TABLET | Freq: Every day | ORAL | 3 refills | Status: AC

## 2018-05-21 MED FILL — SERTRALINE HCL 50 MG TABLET: 50 | 30 days supply | Qty: 30 | Fill #0

## 2018-05-21 NOTE — Progress Notes (Signed)
   Subjective:    Patient ID: Kevin Howe, male    DOB: 11/27/1995, 22 y.o.   MRN: [id number]  HPI Here to discuss anxiety. Over the past 3 months he has felt stressed about things, even though his life is going fairly well right now. He is taking community college classes and working. He has been going with his girlfriend for 9 month and they are happy together. However he often finds himself worrying about things. He cannot relax and he has trouble sleeping at night. He also has some feelings of sadness at times but he denies any hopelessness or crying spells.    Review of Systems  Constitutional: Negative.   Respiratory: Negative.   Cardiovascular: Negative.   Neurological: Negative.   Psychiatric/Behavioral: Positive for dysphoric mood and sleep disturbance. Negative for agitation, behavioral problems, confusion, decreased concentration, hallucinations, self-injury and suicidal ideas. The patient is nervous/anxious.        Objective:   Physical Exam  Constitutional: He is oriented to person, place, and time. He appears well-developed and well-nourished.  Cardiovascular: Normal rate, regular rhythm, normal heart sounds and intact distal pulses.  Pulmonary/Chest: Effort normal and breath sounds normal.  Neurological: He is alert and oriented to person, place, and time.  Psychiatric: He has a normal mood and affect. His behavior is normal. Thought content normal.          Assessment & Plan:  Depression with anxiety. Try Zoloft 50 mg daily. Recheck in 2-3 weeks.  Gershon CraneStephen Tyann Niehaus, MD

## 2018-05-28 ENCOUNTER — Encounter: Payer: 59 | Admitting: Family Medicine

## 2018-07-16 ENCOUNTER — Ambulatory Visit (INDEPENDENT_AMBULATORY_CARE_PROVIDER_SITE_OTHER): Payer: 59 | Admitting: Family Medicine

## 2018-07-16 ENCOUNTER — Encounter: Payer: Self-pay | Admitting: Family Medicine

## 2018-07-16 VITALS — BP 122/60 | HR 61 | Temp 97.9°F | Wt 145.0 lb

## 2018-07-16 DIAGNOSIS — J069 Acute upper respiratory infection, unspecified: Secondary | ICD-10-CM | POA: Diagnosis not present

## 2018-07-16 NOTE — Progress Notes (Signed)
   Subjective:    Patient ID: Kevin Howe, male    DOB: 1996/04/06, 23 y.o.   MRN: [id number]  HPI Here for 3 days of a ST and a mild headache. No fever. No cough. Using Ibuprofen.    Review of Systems  Constitutional: Negative.   HENT: Positive for postnasal drip and sore throat. Negative for congestion, sinus pressure, sinus pain and trouble swallowing.   Eyes: Negative.   Respiratory: Negative.   Gastrointestinal: Negative.        Objective:   Physical Exam Vitals signs and nursing note reviewed.  Constitutional:      Appearance: Normal appearance.  HENT:     Right Ear: Tympanic membrane and ear canal normal.     Left Ear: Tympanic membrane and ear canal normal.     Nose: Nose normal.     Mouth/Throat:     Pharynx: Oropharynx is clear.  Eyes:     Conjunctiva/sclera: Conjunctivae normal.  Pulmonary:     Effort: Pulmonary effort is normal.     Breath sounds: Normal breath sounds.  Lymphadenopathy:     Cervical: No cervical adenopathy.  Neurological:     Mental Status: He is alert.           Assessment & Plan:  Viral URI. Rest and drink fluids. Recheck prn. Gershon Crane, MD

## 2018-09-16 DIAGNOSIS — F3289 Other specified depressive episodes: Secondary | ICD-10-CM | POA: Diagnosis not present

## 2018-09-16 DIAGNOSIS — F1321 Sedative, hypnotic or anxiolytic dependence, in remission: Secondary | ICD-10-CM | POA: Diagnosis not present

## 2018-09-16 DIAGNOSIS — F1421 Cocaine dependence, in remission: Secondary | ICD-10-CM | POA: Diagnosis not present

## 2018-12-27 IMAGING — US US ABDOMEN COMPLETE
1 series · 14 of 25 positions shown · non-contrast
Comparison: None.

CLINICAL DATA: Abdominal pain with nausea and vomiting

EXAM:
ABDOMEN ULTRASOUND COMPLETE

[Series 1: us abdomen complete · 0.15mm/px · 14 of 77 slices shown]
[im 1/77]
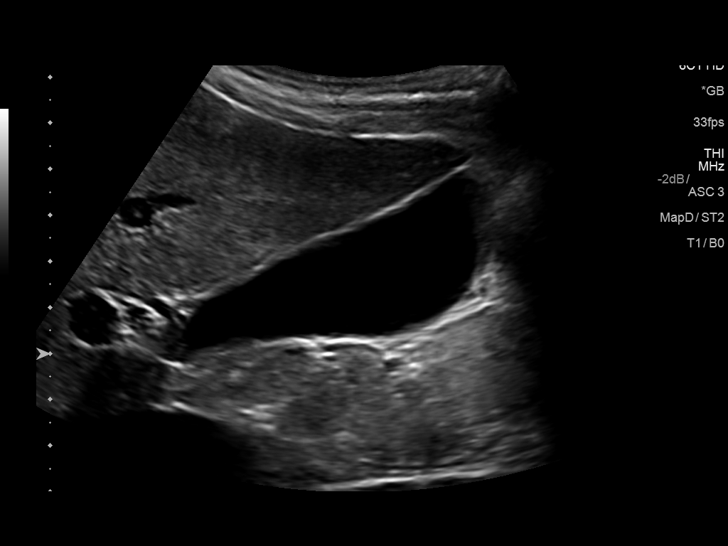
[im 7/77]
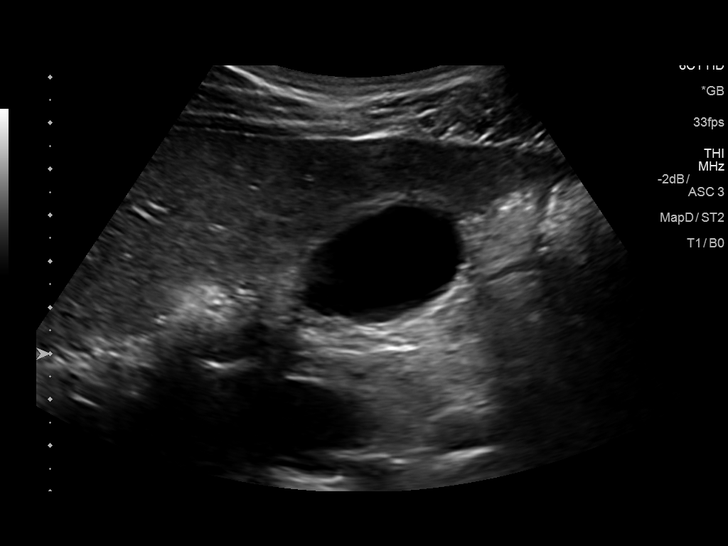
[im 13/77]
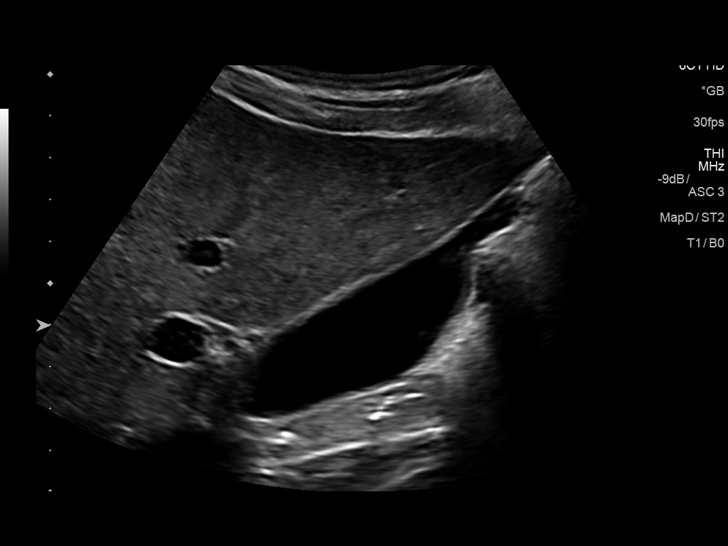
[im 20/77]
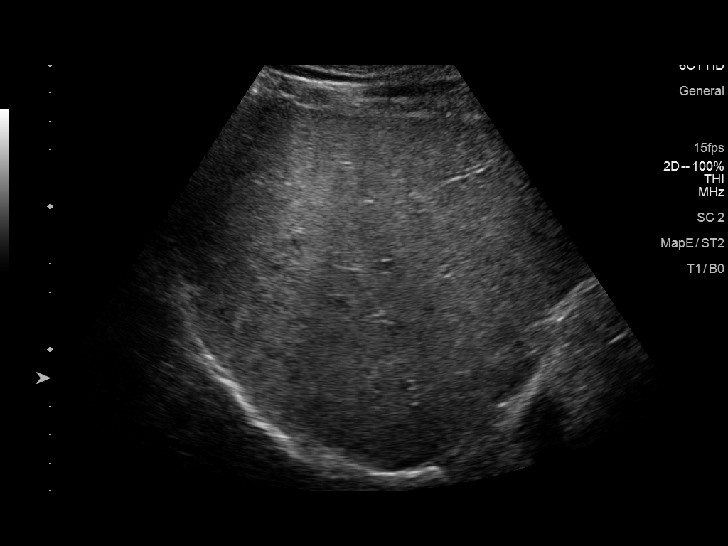
[im 26/77]
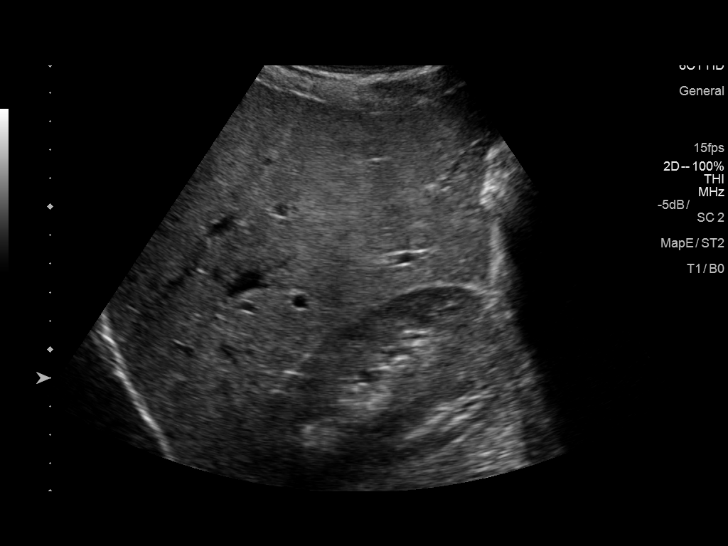
[im 29/77]
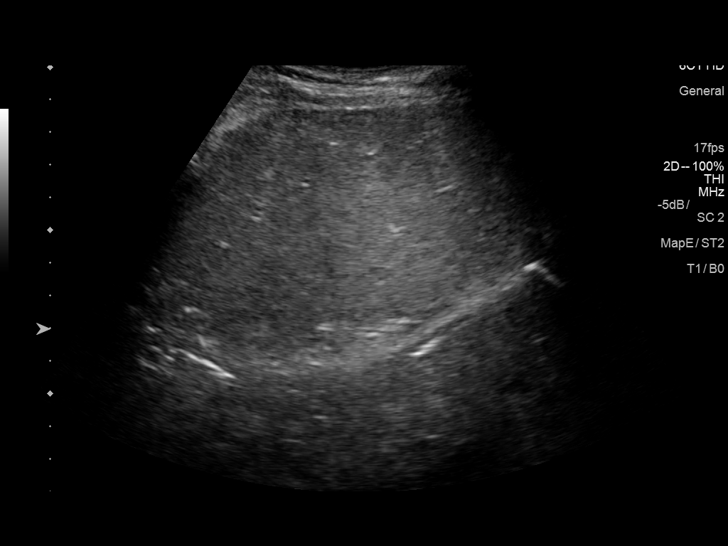
[im 35/77]
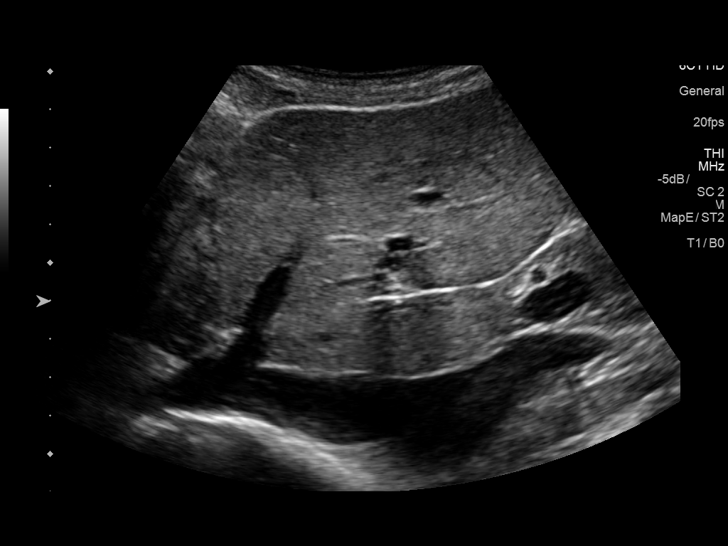
[im 42/77]
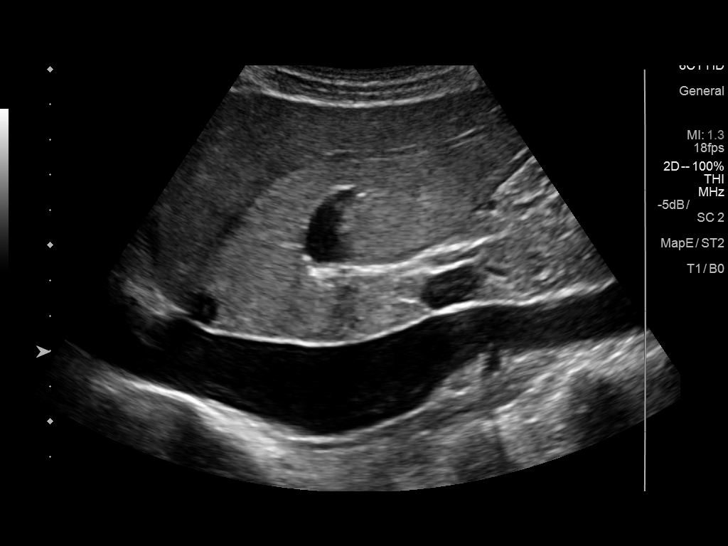
[im 48/77]
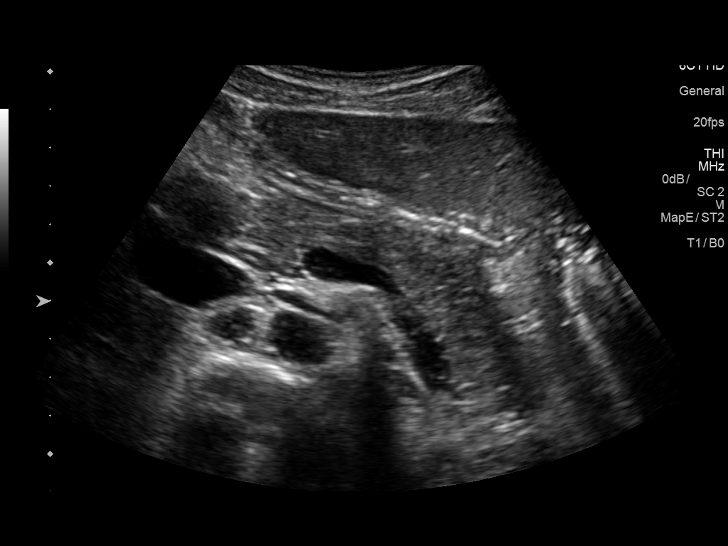
[im 51/77]
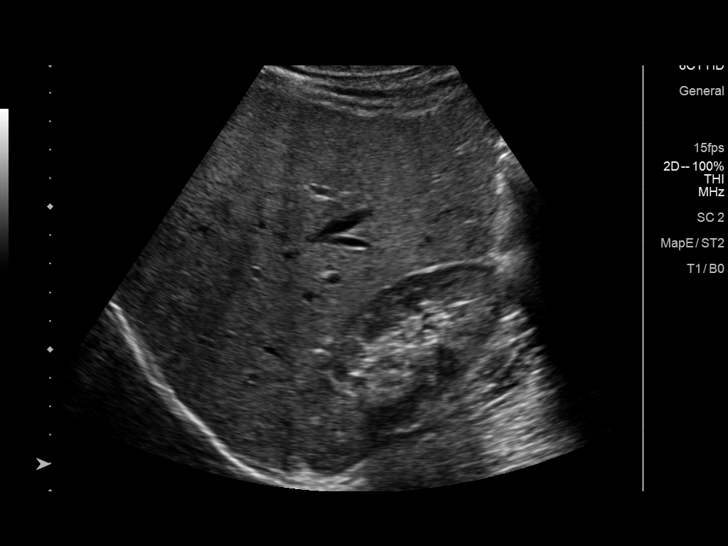
[im 58/77]
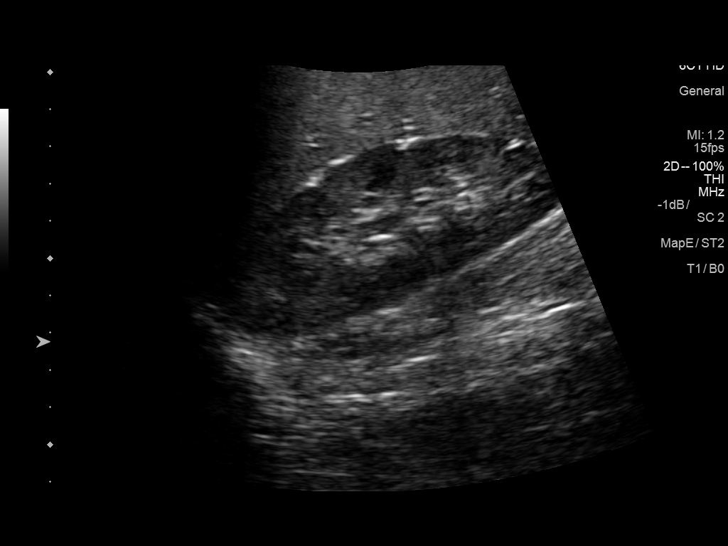
[im 64/77]
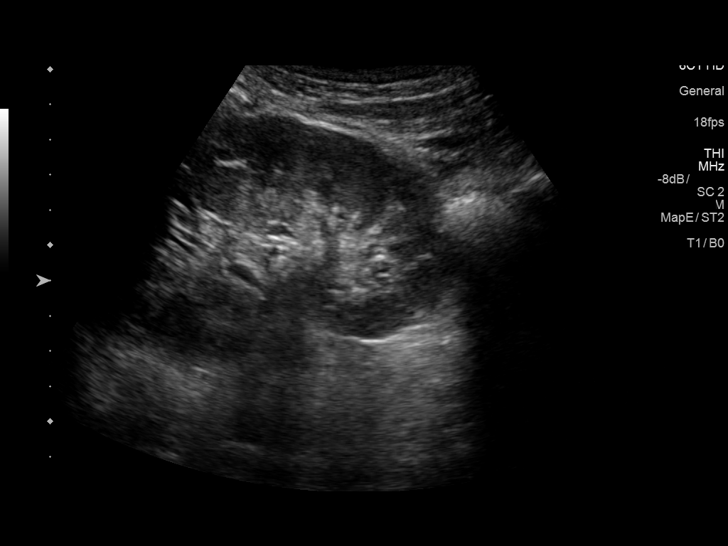
[im 70/77]
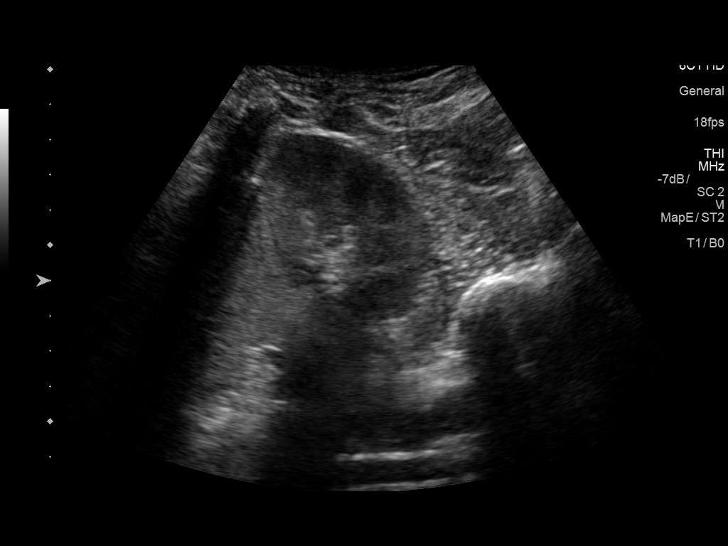
[im 77/77]
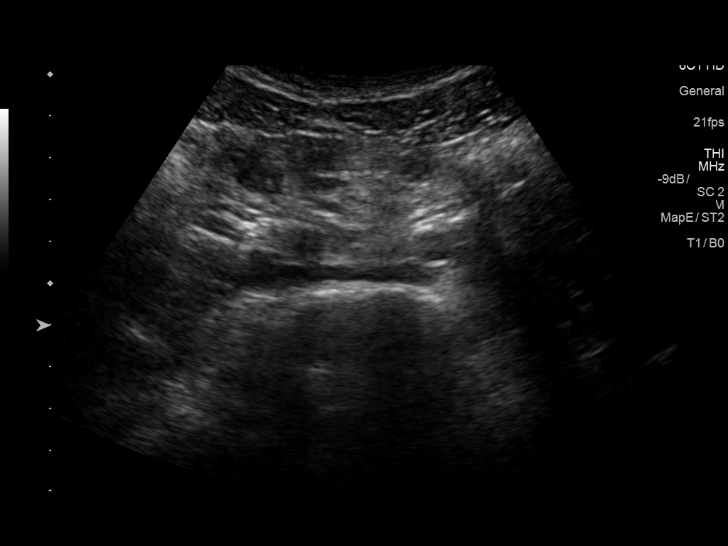

[14 of 25 positions shown; findings below may reference images not displayed]

FINDINGS: Gallbladder: No gallstones or wall thickening visualized. There is
no pericholecystic fluid. No sonographic Murphy sign noted by
sonographer.

Common bile duct: Diameter: 2 mm. No intrahepatic, common hepatic,
or common bile duct dilatation.

Liver: No focal lesion identified. Within normal limits in
parenchymal echogenicity. Portal vein is patent on color Doppler
imaging with normal direction of blood flow towards the liver.

IVC: No abnormality visualized.

Pancreas: No pancreatic mass or inflammatory focus.

Spleen: Size and appearance within normal limits.

Right Kidney: Length: 10.3 cm. Echogenicity within normal limits. No
mass or hydronephrosis visualized.

Left Kidney: Length: 11.3 cm. Echogenicity within normal limits. No
mass or hydronephrosis visualized.

Abdominal aorta: No aneurysm visualized.

Other findings: No demonstrable ascites.
IMPRESSION: Study within normal limits.

## 2019-04-20 ENCOUNTER — Other Ambulatory Visit: Payer: Self-pay

## 2019-04-20 DIAGNOSIS — Z20822 Contact with and (suspected) exposure to covid-19: Secondary | ICD-10-CM

## 2019-04-22 LAB — NOVEL CORONAVIRUS, NAA: SARS-CoV-2, NAA: NOT DETECTED

## 2020-06-27 ENCOUNTER — Other Ambulatory Visit: Payer: Self-pay

## 2020-06-27 DIAGNOSIS — Z20822 Contact with and (suspected) exposure to covid-19: Secondary | ICD-10-CM

## 2020-06-28 LAB — NOVEL CORONAVIRUS, NAA: SARS-CoV-2, NAA: NOT DETECTED

## 2020-06-28 LAB — SARS-COV-2, NAA 2 DAY TAT

## 2021-12-04 ENCOUNTER — Encounter (HOSPITAL_COMMUNITY): Payer: Self-pay | Admitting: Emergency Medicine

## 2021-12-04 ENCOUNTER — Ambulatory Visit (HOSPITAL_COMMUNITY)
Admission: EM | Admit: 2021-12-04 | Discharge: 2021-12-04 | Disposition: A | Discharge status: Home / Self Care | Payer: 59 | Attending: Internal Medicine | Admitting: Internal Medicine

## 2021-12-04 ENCOUNTER — Other Ambulatory Visit: Payer: Self-pay

## 2021-12-04 DIAGNOSIS — S61412A Laceration without foreign body of left hand, initial encounter: Secondary | ICD-10-CM | POA: Diagnosis not present

## 2021-12-04 DIAGNOSIS — Z23 Encounter for immunization: Secondary | ICD-10-CM

## 2021-12-04 MED ORDER — TETANUS-DIPHTH-ACELL PERTUSSIS 5-2.5-18.5 LF-MCG/0.5 IM SUSY
0.5000 mL | PREFILLED_SYRINGE | Freq: Once | INTRAMUSCULAR | Status: AC
  Administered 2021-12-04: 0.5 mL via INTRAMUSCULAR

## 2021-12-04 MED ORDER — TETANUS-DIPHTH-ACELL PERTUSSIS 5-2.5-18.5 LF-MCG/0.5 IM SUSY
PREFILLED_SYRINGE | INTRAMUSCULAR | Status: AC
  Filled 2021-12-04: qty 0.5

## 2021-12-04 MED ORDER — IBUPROFEN 600 MG PO TABS: 600.0000 mg | ORAL_TABLET | Freq: Four times a day (QID) | ORAL | 0 refills | Status: AC | PRN

## 2021-12-04 NOTE — ED Provider Notes (Signed)
[place]    CSN: [id number] Arrival date & time: 12/04/21  1144      History   Chief Complaint Chief Complaint  Patient presents with   Laceration    HPI Kevin Howe is a 26 y.o. male.   Patient presents to urgent care for evaluation after he was cutting an insert for a door when the knife slipped and he cut himself to his left thumb PIP to the dorsal aspect of his left hand.  Patient is able to move his thumb without difficulty and pain is minimal at this time.  Laceration is no longer bleeding.  No foreign body to wound.  He has not taken any medications for pain and the laceration occurred at 11 AM today approximately 3 hours ago.  He denies numbness and tingling distal to injury.  Last tetanus shot was approximately 8 years ago.  No other aggravating or relieving factors for symptoms at this time.  Patient is a 26 year old male presenting to urgent care with a left PIP joint thumb laceration presenting to urgent care with a left PIP with a left PIP with a left PIP thumb joint laceration.  Patient has full range of motion of his left thumb.  Laceration repaired successfully..  Patient instructed to change dressing twice daily with nonadherent dressing    Past Medical History:  Diagnosis Date   Acne vulgaris    sees Dr. Allyson Sabal    Allergy    saw Dr. Mosetta Anis    Infectious mononucleosis 03-11-14    Patient Active Problem List   Diagnosis Date Noted   Infectious mononucleosis 03/22/2014    History reviewed. No pertinent surgical history.     Home Medications    Prior to Admission medications   Medication Sig Start Date End Date Taking? Authorizing Provider  ibuprofen (ADVIL) 600 MG tablet Take 1 tablet (600 mg total) by mouth every 6 (six) hours as needed. 12/04/21  Yes Talbot Grumbling, FNP  sertraline (ZOLOFT) 50 MG tablet Take 1 tablet (50 mg total) by mouth daily. Patient not taking: Reported on 12/04/2021 05/21/18   Laurey Morale, MD     Family History Family History  Problem Relation Age of Onset   Healthy Mother     Social History Social History   Tobacco Use   Smoking status: Every Day    Types: Cigarettes   Smokeless tobacco: Never  Vaping Use   Vaping Use: Every day  Substance Use Topics   Alcohol use: Yes   Drug use: No     Allergies   Peanut-containing drug products   Review of Systems Review of Systems Per HPI  Physical Exam Triage Vital Signs ED Triage Vitals  Enc Vitals Group     BP 12/04/21 1325 116/68     Pulse Rate 12/04/21 1325 70     Resp 12/04/21 1325 18     Temp 12/04/21 1325 98.3 F (36.8 C)     Temp Source 12/04/21 1325 Oral     SpO2 12/04/21 1325 98 %     Weight --      Height --      Head Circumference --      Peak Flow --      Pain Score 12/04/21 1322 1     Pain Loc --      Pain Edu? --      Excl. in [place]? --    No data found.  Updated Vital Signs BP 116/68 (  BP Location: Right Arm)   Pulse 70   Temp 98.3 F (36.8 C) (Oral)   Resp 18   SpO2 98%   Visual Acuity Right Eye Distance:   Left Eye Distance:   Bilateral Distance:    Right Eye Near:   Left Eye Near:    Bilateral Near:     Physical Exam Vitals and nursing note reviewed.  Constitutional:      General: He is not in acute distress.    Appearance: Normal appearance. He is well-developed and normal weight. He is not ill-appearing.     Comments: Very pleasant patient sitting comfortably on exam able in no acute distress.   HENT:     Head: Normocephalic and atraumatic.     Right Ear: External ear normal.     Left Ear: External ear normal.     Nose: Nose normal.     Mouth/Throat:     Mouth: Mucous membranes are moist.     Pharynx: No posterior oropharyngeal erythema.  Eyes:     General: Lids are normal. Vision grossly intact. Gaze aligned appropriately.     Extraocular Movements: Extraocular movements intact.     Conjunctiva/sclera: Conjunctivae normal.     Right eye: Right conjunctiva is  not injected.     Left eye: Left conjunctiva is not injected.  Cardiovascular:     Rate and Rhythm: Normal rate and regular rhythm.     Heart sounds: Normal heart sounds, S1 normal and S2 normal.  Pulmonary:     Effort: Pulmonary effort is normal. No respiratory distress.     Breath sounds: Normal breath sounds. No decreased air movement.  Abdominal:     Palpations: Abdomen is soft.  Musculoskeletal:        General: No swelling.     Cervical back: Neck supple.  Skin:    General: Skin is warm and dry.     Capillary Refill: Capillary refill takes less than 2 seconds.     Findings: Laceration present. No rash.     Comments: 5 cm laceration to patient's left thumb PIP joint to the dorsal aspect of his hand.  See photo in chart for further detail.  No foreign body noted after wound exploration.  Tendon visualized.  Range of motion to left thumb is normal and not impacted by pain or laceration.  He is neurovascularly intact distal to injury.  He has never injured this thumb in the past.  Neurological:     General: No focal deficit present.     Mental Status: He is alert and oriented to person, place, and time. Mental status is at baseline.     Sensory: No sensory deficit.     Gait: Gait is intact.  Psychiatric:        Attention and Perception: Attention and perception normal.        Mood and Affect: Mood normal.        Speech: Speech normal.        Behavior: Behavior normal. Behavior is cooperative.        Thought Content: Thought content normal.        Cognition and Memory: Cognition and memory normal.        Judgment: Judgment normal.      UC Treatments / Results  Labs (all labs ordered are listed, but only abnormal results are displayed) Labs Reviewed - No data to display  EKG   Radiology No results found.  Procedures Laceration Repair  Date/Time: 12/04/2021 2:52  PM Performed by: Talbot Grumbling, FNP Authorized by: Talbot Grumbling, FNP   Consent:     Consent obtained:  Verbal   Consent given by:  Patient   Risks, benefits, and alternatives were discussed: yes     Risks discussed:  Infection, pain, retained foreign body, need for additional repair, poor cosmetic result, poor wound healing and nerve damage   Alternatives discussed:  No treatment Anesthesia:    Anesthesia method:  Local infiltration   Local anesthetic:  Lidocaine 2% w/o epi Laceration details:    Location:  Hand   Hand location:  L hand, dorsum (Left thumb dorsum)   Length (cm):  5   Depth (mm):  0.5 Treatment:    Area cleansed with:  Povidone-iodine   Amount of cleaning:  Standard   Irrigation solution:  Tap water   Irrigation method:  Syringe Skin repair:    Repair method:  Sutures   Suture size:  4-0   Suture material:  Prolene   Suture technique:  Simple interrupted   Number of sutures:  4 Approximation:    Approximation:  Close Repair type:    Repair type:  Simple Post-procedure details:    Dressing:  Non-adherent dressing   Procedure completion:  Tolerated well, no immediate complications (including critical care time)  Medications Ordered in UC Medications  Tdap (BOOSTRIX) injection 0.5 mL (0.5 mLs Intramuscular Given 12/04/21 1349)    Initial Impression / Assessment and Plan / UC Course  I have reviewed the triage vital signs and the nursing notes.  Pertinent labs & imaging results that were available during my care of the patient were reviewed by me and considered in my medical decision making (see chart for details).  Patient is a 26 year old male presenting to urgent care with a left PIP thumb joint laceration with visualization of his tendon. Tendon is intact and patient has full range of motion of his left thumb joint. Laceration repaired successfully, see procedure note above for more detail.  Wound cleansed and dressed in clinic. Patient instructed to change dressing daily with nonadherent gauze and coban wrap. Monitor for site for signs of  infection and return in 7 days for suture removal. If signs of infection are noted, instructed to return sooner for re-evaluation. Instructed to keep wound dry for 24 hours. No lotions, ointments, or powder to wound. Ibuprofen 600mg  may be used as needed for pain. He is to keep wound clean and dry as it heals and avoid getting site dirty.   Counseled patient regarding appropriate use of medications and potential side effects for all medications recommended or prescribed today. Discussed red flag signs and symptoms of worsening condition,when to call the PCP office, return to urgent care, and when to seek higher level of care. Patient verbalizes understanding and agreement with plan. All questions answered. Patient discharged in stable condition.   Final Clinical Impressions(s) / UC Diagnoses   Final diagnoses:  Laceration of left hand without foreign body, initial encounter     Discharge Instructions      You were seen in urgent care today for your laceration to your left thumb.  Do not get your left thumb wet until tomorrow afternoon.  Come back in 7 days to have your sutures removed.  Change the dressing to your left thumb twice daily for the next 7 days with a nonadherent dressing and Coban wrap.  Avoid getting your thumb dirty at work to prevent infection.  If you notice any redness, drainage,  or warmth, please return to urgent care sooner than 7 days to have your wound assessed for possible infection.   Take ibuprofen 600 mg every 6 hours as needed for any pain you may have.  Be sure to take this medication with food as it can cause GI upset.  If you develop any new or worsening symptoms or do not improve in the next 2 to 3 days, please return.  If your symptoms are severe, please go to the emergency room.  Follow-up with your primary care provider for further evaluation and management of your symptoms as well as ongoing wellness visits.  I hope you feel better!     ED Prescriptions      Medication Sig Dispense Auth. Provider   ibuprofen (ADVIL) 600 MG tablet Take 1 tablet (600 mg total) by mouth every 6 (six) hours as needed. 30 tablet Talbot Grumbling, FNP      PDMP not reviewed this encounter.   Talbot Grumbling, [facility] 12/04/21 1454

## 2021-12-04 NOTE — Discharge Instructions (Signed)
You were seen in urgent care today for your laceration to your left thumb.  Do not get your left thumb wet until tomorrow afternoon.  Come back in 7 days to have your sutures removed.  Change the dressing to your left thumb twice daily for the next 7 days with a nonadherent dressing and Coban wrap.  Avoid getting your thumb dirty at work to prevent infection.  If you notice any redness, drainage, or warmth, please return to urgent care sooner than 7 days to have your wound assessed for possible infection.   Take ibuprofen 600 mg every 6 hours as needed for any pain you may have.  Be sure to take this medication with food as it can cause GI upset.  If you develop any new or worsening symptoms or do not improve in the next 2 to 3 days, please return.  If your symptoms are severe, please go to the emergency room.  Follow-up with your primary care provider for further evaluation and management of your symptoms as well as ongoing wellness visits.  I hope you feel better!

## 2021-12-04 NOTE — ED Triage Notes (Signed)
Was cutting incert for a door, knife slipped and cut left thumb across a knuckle per patient.  Incident occurred around 11:00 am.    Last tetanus possibly 8 years ago.    Removed dressing, currently not bleeding

## 2023-11-08 ENCOUNTER — Emergency Department (HOSPITAL_BASED_OUTPATIENT_CLINIC_OR_DEPARTMENT_OTHER)
Admission: EM | Admit: 2023-11-08 | Discharge: 2023-11-08 | Disposition: A | Discharge status: Home / Self Care | Attending: Emergency Medicine | Admitting: Emergency Medicine

## 2023-11-08 ENCOUNTER — Encounter (HOSPITAL_BASED_OUTPATIENT_CLINIC_OR_DEPARTMENT_OTHER): Payer: Self-pay | Admitting: Emergency Medicine

## 2023-11-08 ENCOUNTER — Other Ambulatory Visit: Payer: Self-pay

## 2023-11-08 DIAGNOSIS — Y9389 Activity, other specified: Secondary | ICD-10-CM | POA: Diagnosis not present

## 2023-11-08 DIAGNOSIS — S0990XA Unspecified injury of head, initial encounter: Secondary | ICD-10-CM | POA: Diagnosis present

## 2023-11-08 DIAGNOSIS — Z91010 Allergy to peanuts: Secondary | ICD-10-CM | POA: Insufficient documentation

## 2023-11-08 DIAGNOSIS — S0101XA Laceration without foreign body of scalp, initial encounter: Secondary | ICD-10-CM

## 2023-11-08 DIAGNOSIS — W208XXA Other cause of strike by thrown, projected or falling object, initial encounter: Secondary | ICD-10-CM | POA: Diagnosis not present

## 2023-11-08 MED ORDER — LIDOCAINE HCL (PF) 1 % IJ SOLN
10.0000 mL | Freq: Once | INTRAMUSCULAR | Status: AC
  Administered 2023-11-08: 10 mL
  Filled 2023-11-08: qty 10

## 2023-11-08 NOTE — Discharge Instructions (Signed)
 You had 8 non-absorbable sutures placed today. You must get your sutures rechecked for possible removal in 7-10 days. We recommend visiting your PCP or an urgent care for suture removal. However, you may also return back to the ER if you are unable to be seen by your PCP or at urgent care.   You may gently clean the area around your laceration as needed with soap and water. Place antibiotic ointment such as bacitracin or neosporin over your laceration after cleaning the area.  Keep the laceration covered if you are doing an activity in which it may get dirty.   Do not submerge your laceration in water (no baths, swimming) until it is fully healed. You may shower.   You may take up to 1000mg  of tylenol every 6 hours as needed for pain. Do not take more then 4g per day.   You may use up to 600mg  ibuprofen  every 6 hours as needed for pain.  Do not exceed 2.4g of ibuprofen  per day.  Return to the ER should you develop fever, chills, pus drainage from your wound, redness around your wound.

## 2023-11-08 NOTE — ED Triage Notes (Signed)
 Piece of wood fell from above and hit head. Lac to top of head. "It when black for a second" Aox 4 GCS 15 Happened around 12:30

## 2023-11-08 NOTE — ED Provider Notes (Signed)
 [place] EMERGENCY DEPARTMENT AT [facility] Provider Note   CSN: [id number] Arrival date & time: 11/08/23  1329     History  Chief Complaint  Patient presents with   Head Injury    Kevin Howe is a 28 y.o. male who presents with concern for head laceration that occurred earlier today.  States he was installing a wooden beam when the beam fell down onto his head.  He denies any loss of consciousness but says he heard a ringing sensation for couple seconds.  Denies any nausea or vomiting, vision changes.    Head Injury Associated symptoms: no headaches        Home Medications Prior to Admission medications   Medication Sig Start Date End Date Taking? Authorizing Provider  ibuprofen  (ADVIL ) 600 MG tablet Take 1 tablet (600 mg total) by mouth every 6 (six) hours as needed. 12/04/21   Starlene Eaton, FNP  sertraline  (ZOLOFT ) 50 MG tablet Take 1 tablet (50 mg total) by mouth daily. Patient not taking: Reported on 12/04/2021 05/21/18   Donley Furth, MD      Allergies    Peanut-containing drug products    Review of Systems   Review of Systems  Skin:  Positive for wound.  Neurological:  Negative for light-headedness and headaches.    Physical Exam Updated Vital Signs BP 133/76   Pulse 67   Temp 98.3 F (36.8 C)   Resp 18   SpO2 100%  Physical Exam Vitals and nursing note reviewed.  Constitutional:      Appearance: Normal appearance.  HENT:     Head: Normocephalic and atraumatic.     Comments: No skull TTP Eyes:     Extraocular Movements: Extraocular movements intact.     Pupils: Pupils are equal, round, and reactive to light.  Cardiovascular:     Rate and Rhythm: Normal rate and regular rhythm.  Pulmonary:     Effort: Pulmonary effort is normal.  Skin:    Comments: 3cm laceration to posterior scalp with bleeding well controlled No foreign debris  Neurological:     General: No focal deficit present.     Mental Status: He is alert and  oriented to person, place, and time.  Psychiatric:        Mood and Affect: Mood normal.        Behavior: Behavior normal.     ED Results / Procedures / Treatments   Labs (all labs ordered are listed, but only abnormal results are displayed) Labs Reviewed - No data to display  EKG None  Radiology No results found.  Procedures .Laceration Repair  Date/Time: 11/08/2023 3:49 PM  Performed by: Rexie Catena, PA-C Authorized by: Rexie Catena, PA-C   Consent:    Consent obtained:  Verbal   Consent given by:  Patient   Risks discussed:  Infection, pain, poor cosmetic result and poor wound healing Universal protocol:    Procedure explained and questions answered to patient or proxy's satisfaction: yes     Patient identity confirmed:  Verbally with patient Anesthesia:    Anesthesia method:  Local infiltration   Local anesthetic:  Lidocaine 1% w/o epi (6ml) Laceration details:    Location:  Scalp   Scalp location:  Occipital   Length (cm):  3   Depth (mm):  3 Pre-procedure details:    Preparation:  Patient was prepped and draped in usual sterile fashion Exploration:    Wound exploration: wound explored through full range of motion and entire depth  of wound visualized   Treatment:    Area cleansed with:  Chlorhexidine   Irrigation solution:  Sterile saline   Irrigation volume:  2L   Debridement:  None   Undermining:  None Skin repair:    Repair method:  Sutures   Suture size:  4-0   Suture material:  Prolene   Suture technique:  Simple interrupted   Number of sutures:  8 Repair type:    Repair type:  Simple Post-procedure details:    Dressing:  Open (no dressing)   Procedure completion:  Tolerated well, no immediate complications     Medications Ordered in ED Medications  lidocaine (PF) (XYLOCAINE) 1 % injection 10 mL (10 mLs Infiltration Given 11/08/23 1537)    ED Course/ Medical Decision Making/ A&P                                 Medical Decision  Making Risk Prescription drug management.     Differential diagnosis includes but is not limited to laceration, retained foreign body, intracranial hemorrhage, skull fracture  ED Course:  Upon initial evaluation, patient is well-appearing, stable vital signs.  Piece of wood fell onto his head about 4 hours prior to arrival to the ER.  Bleeding well-controlled upon arrival.  He denies any loss of consciousness, no change in mental status, neurologically intact, low concern for any intracranial hemorrhage at this time.  No indication for imaging.  No skull tenderness to palpation.  He has a 3 cm laceration on the posterior scalp that was anesthetized with lidocaine and cleaned with sterile saline.  Attempted laceration repair with staples, but staples were not staying in place and so laceration repair was then done with suture. Patient tolerated this well.  Patient declined any pain medication.  Stable appropriate for discharge home.    Impression: Scalp laceration  Disposition:  The patient was discharged home with instructions to keep area clean with soap and water.  Return for suture removal in 7-10 days.  Tylenol and ibuprofen  as needed for pain. Return precautions given.     This chart was dictated using voice recognition software, Dragon. Despite the best efforts of this provider to proofread and correct errors, errors may still occur which can change documentation meaning.          Final Clinical Impression(s) / ED Diagnoses Final diagnoses:  Laceration of scalp without foreign body, initial encounter    Rx / DC Orders ED Discharge Orders     None         Rexie Catena, PA-C 11/08/23 1554    Afton Horse T, DO 11/10/23 1538

## 2023-11-08 NOTE — ED Notes (Signed)
 ED Provider at bedside for lac repair
